# Patient Record
Sex: Female | Born: 1985 | Race: Black or African American | Hispanic: No | Marital: Single | Smoking: Former smoker
Health system: Southern US, Community
[De-identification: ages and names within clinical notes are randomized; demographics above are authoritative.]

---

## 2018-02-11 ENCOUNTER — Emergency Department (HOSPITAL_COMMUNITY): Payer: Self-pay

## 2018-02-11 ENCOUNTER — Other Ambulatory Visit: Payer: Self-pay

## 2018-02-11 ENCOUNTER — Emergency Department (HOSPITAL_COMMUNITY)
Admission: EM | Admit: 2018-02-11 | Discharge: 2018-02-11 | Disposition: A | Discharge status: Home / Self Care | Payer: Self-pay | Attending: Emergency Medicine | Admitting: Emergency Medicine

## 2018-02-11 ENCOUNTER — Encounter (HOSPITAL_COMMUNITY): Payer: Self-pay

## 2018-02-11 DIAGNOSIS — R1030 Lower abdominal pain, unspecified: Secondary | ICD-10-CM

## 2018-02-11 DIAGNOSIS — Z9189 Other specified personal risk factors, not elsewhere classified: Secondary | ICD-10-CM

## 2018-02-11 DIAGNOSIS — N83202 Unspecified ovarian cyst, left side: Secondary | ICD-10-CM | POA: Insufficient documentation

## 2018-02-11 LAB — COMPREHENSIVE METABOLIC PANEL
ALT: 12 U/L (ref 0–44)
AST: 14 U/L — ABNORMAL LOW (ref 15–41)
Albumin: 4.3 g/dL (ref 3.5–5.0)
Alkaline Phosphatase: 48 U/L (ref 38–126)
Anion gap: 6 (ref 5–15)
BUN: 12 mg/dL (ref 6–20)
CO2: 24 mmol/L (ref 22–32)
CREATININE: 0.64 mg/dL (ref 0.44–1.00)
Calcium: 8.8 mg/dL — ABNORMAL LOW (ref 8.9–10.3)
Chloride: 107 mmol/L (ref 98–111)
GFR calc non Af Amer: >60 mL/min (ref >60)
Glucose, Bld: 88 mg/dL (ref 70–99)
Potassium: 4 mmol/L (ref 3.5–5.1)
Sodium: 137 mmol/L (ref 135–145)
Total Bilirubin: 0.5 mg/dL (ref 0.3–1.2)
Total Protein: 7.1 g/dL (ref 6.5–8.1)

## 2018-02-11 LAB — URINALYSIS, ROUTINE W REFLEX MICROSCOPIC
Bilirubin Urine: NEGATIVE
Glucose, UA: NEGATIVE mg/dL
Hgb urine dipstick: NEGATIVE
Ketones, ur: NEGATIVE mg/dL
Leukocytes, UA: NEGATIVE
NITRITE: NEGATIVE
PROTEIN: NEGATIVE mg/dL
Specific Gravity, Urine: 1.024 (ref 1.005–1.030)
pH: 5 (ref 5.0–8.0)

## 2018-02-11 LAB — CBC
HCT: 43.4 % (ref 36.0–46.0)
Hemoglobin: 14.1 g/dL (ref 12.0–15.0)
MCH: 32 pg (ref 26.0–34.0)
MCHC: 32.5 g/dL (ref 30.0–36.0)
MCV: 98.6 fL (ref 80.0–100.0)
NRBC: 0 % (ref 0.0–0.2)
Platelets: 235 10*3/uL (ref 150–400)
RBC: 4.4 MIL/uL (ref 3.87–5.11)
RDW: 13.9 % (ref 11.5–15.5)
WBC: 4.8 10*3/uL (ref 4.0–10.5)

## 2018-02-11 LAB — I-STAT BETA HCG BLOOD, ED (MC, WL, AP ONLY)

## 2018-02-11 LAB — LIPASE, BLOOD: Lipase: 29 U/L (ref 11–51)

## 2018-02-11 MED ORDER — DICYCLOMINE HCL 10 MG PO CAPS
10.0000 mg | ORAL_CAPSULE | Freq: Once | ORAL | Status: AC
  Administered 2018-02-11: 10 mg via ORAL
  Filled 2018-02-11: qty 1

## 2018-02-11 NOTE — ED Provider Notes (Signed)
[facility]-EMERGENCY DEPT Provider Note   CSN: [id number] Arrival date & time: 02/11/18  0849   History   Chief Complaint Chief Complaint  Patient presents with  . Abdominal Pain    HPI Patricia Watkins is a 32 y.o. female with no past medical history who presents for evaluation of lower abdominal pain.  Patient states pain is been present x3 days.  Patient states her family members did have "a stomach bug but it went away."  Describes her pain as a cramping sensation to her lower abdomen. States she has had call out work the last 2 days due to the pain, when she called out today her employer she would need a note due to missing 3 days. Rates her pain a 5/10. Pain does not radiate. States pain is intermittent in nature. Denies fever, chill, nausea, vomiting, chest pain, SOB, reflux symptoms, dysuria, diarrhea, constipation, vaginal discharge, pelvic pain, concern for STDs, melena, BRBPR.  States she does not want a pelvic exam at this time.  Denies history of previous abdominal surgeries.  States she has been able to tolerate p.o. intake without difficulty.  Has not taken anything for her pain.  History obtained from patient. No interpretor was used.  HPI  History reviewed. No pertinent past medical history.  There are no active problems to display for this patient.    OB History   None      Home Medications    Prior to Admission medications   Not on File    Family History History reviewed. No pertinent family history.  Social History Social History   Tobacco Use  . Smoking status: Not on file  Substance Use Topics  . Alcohol use: Not on file  . Drug use: Not on file     Allergies   Patient has no known allergies.   Review of Systems Review of Systems  Constitutional: Negative.   Respiratory: Negative.   Cardiovascular: Negative.   Gastrointestinal: Positive for abdominal pain. Negative for abdominal distention, anal bleeding, blood  in stool, constipation, diarrhea, nausea, rectal pain and vomiting.  Genitourinary: Negative.   Musculoskeletal: Negative.   Skin: Negative.   Neurological: Negative for dizziness, weakness and light-headedness.  All other systems reviewed and are negative.    Physical Exam Updated Vital Signs BP 103/74   Pulse 80   Temp 97.8 F (36.6 C) (Oral)   Resp 18   LMP 02/04/2018   SpO2 100%   Physical Exam  Constitutional: Vital signs are normal. She appears well-developed and well-nourished.  Non-toxic appearance. She does not have a sickly appearance. She does not appear ill. No distress.  HENT:  Head: Atraumatic.  Mouth/Throat: Oropharynx is clear and moist.  Eyes: Pupils are equal, round, and reactive to light.  Neck: Normal range of motion.  Cardiovascular: Normal rate, regular rhythm, normal heart sounds, intact distal pulses and normal pulses. Exam reveals no friction rub.  No murmur heard. Pulmonary/Chest: Effort normal. No accessory muscle usage or stridor. No tachypnea. No respiratory distress. She has no decreased breath sounds. She has no wheezes. She has no rhonchi. She has no rales. She exhibits no tenderness.  Abdominal: Soft. Normal appearance and bowel sounds are normal. She exhibits no shifting dullness, no distension, no pulsatile liver, no fluid wave, no ascites, no pulsatile midline mass and no mass. There is no hepatosplenomegaly. There is no tenderness. There is no rigidity, no rebound, no guarding and no CVA tenderness.  Genitourinary:  Genitourinary Comments:  Patient refuses GU exam.  Musculoskeletal: Normal range of motion.       Lumbar back: Normal.  Lymphadenopathy: No inguinal adenopathy noted on the right or left side.  Neurological: She is alert.  Skin: Skin is warm and dry.  No edema, erythema or warmth.  Psychiatric: She has a normal mood and affect.  Nursing note and vitals reviewed.    ED Treatments / Results  Labs (all labs ordered are listed,  but only abnormal results are displayed) Labs Reviewed  COMPREHENSIVE METABOLIC PANEL - Abnormal; Notable for the following components:      Result Value   Calcium 8.8 (*)    AST 14 (*)    All other components within normal limits  URINALYSIS, ROUTINE W REFLEX MICROSCOPIC - Abnormal; Notable for the following components:   APPearance HAZY (*)    All other components within normal limits  LIPASE, BLOOD  CBC  I-STAT BETA HCG BLOOD, ED ([facility], [facility], [facility] ONLY)    EKG None  Radiology US Pelvic Complete W Transvaginal And Torsion R/o  Result Date: 02/11/2018 CLINICAL DATA:  32 year old female with pain for 3 days. LMP 02/04/2018. EXAM: TRANSABDOMINAL AND TRANSVAGINAL ULTRASOUND OF PELVIS TECHNIQUE: Both transabdominal and transvaginal ultrasound examinations of the pelvis were performed. Transabdominal technique was performed for global imaging of the pelvis including uterus, ovaries, adnexal regions, and pelvic cul-de-sac. It was necessary to proceed with endovaginal exam following the transabdominal exam to visualize the ovaries. COMPARISON:  None FINDINGS: Uterus Measurements: 10.0 x 5.0 x 6.2 centimeters = volume: 162 mL. No fibroids or other mass visualized. Endometrium Thickness: 12 millimeters.  No focal abnormality visualized. Right ovary Measurements: 4.1 x 2.7 x 3.3 centimeters = volume: 19 mL. Heterogeneous parenchyma as seen on images 38 and 46 with a 15 millimeter area of hypoechogenicity with a reticular pattern of echoes. No vascular elements. Left ovary Measurements: 4.0 x 2.4 x 2.1 centimeters = volume: 11 mL. Normal appearance/no adnexal mass. Other findings Small volume of free fluid, simple to mildly complex. IMPRESSION: 1. Small complex right ovarian lesion most resembling a physiologic hemorrhagic cyst. 2. Small volume of pelvic free fluid, also probably physiologic. 3. Normal uterus and left ovary. Electronically Signed   By: Odessa Fleming M.D.   On: 02/11/2018 11:53     Procedures Procedures (including critical care time)  Medications Ordered in ED Medications  dicyclomine (BENTYL) capsule 10 mg (10 mg Oral Given 02/11/18 1107)     Initial Impression / Assessment and Plan / ED Course  I have reviewed the triage vital signs and the nursing notes.  Pertinent labs & imaging results that were available during my care of the patient were reviewed by me and considered in my medical decision making (see chart for details).   32 year old female who appears otherwise well presents for evaluation of abdominal pain.  Pain is been intermittent in nature over the last 3 days.  Pain is located to the lower abdomen.  Denies pelvic pain, vaginal discharge.  Is concerned for STDs and does not want a pelvic exam at this time.  Patient states "I am only here because I called out of work the last 3 days and they said I need to be evaluated to return to work."  Denies fever, nausea, vomiting, diarrhea.  Suprapubic tenderness on exam.  No left lower quadrant or right lower quadrant tenderness on exam.  Exam limited secondary to patient refusing pelvic exam.  Discussed with patient CT scan for evaluation of abdominal  pain.  Discussed risk vs benefit.  Patient voices understanding of risk vs benefit, however refuses additional imaging at this time.  Afebrile, nonseptic, non-ill-appearing.  Able to tolerate p.o. intake in department.  Will obtain labs, urine, US and reevaluate.  On reevaluation patient without abdominal tenderness on re-exam.  No rebound, rigidity or guarding.  Lipase, 29, CBC without evidence of leukocytosis, metabolic panel without any electrolyte abnormality, normal renal and liver function, urinalysis without evidence of infection, hCG negative. US negative.  Patient is nontoxic, nonseptic appearing, in no apparent distress.  Patient's pain and other symptoms adequately managed in emergency department.  Fluid bolus given.  Labs, imaging and vitals reviewed.   Patient does not meet the SIRS or Sepsis criteria.  On repeat exam patient does not have a surgical abdomin and there are no peritoneal signs.  No indication of appendicitis, bowel obstruction, bowel perforation, cholecystitis, diverticulitis, PID or ectopic pregnancy.  Patient discharged home with symptomatic treatment and given strict instructions for follow-up with their primary care physician.  Patient is hemodynamically stable and appropriate for DC home at this time.  I have also discussed reasons to return immediately to the ER.  Patient expresses understanding and agrees with plan.  Clinical Course as of Feb 12 1556  Thu Feb 11, 2018  1544 No evidence of pancreatitis  Lipase, blood [BH]  1544 No evidence of leukocytosis.  Hemoglobin 14.1  CBC [BH]  1544 Metabolic panel without electrolyte abnormality, normal renal liver function.  Comprehensive metabolic panel(!) [BH]  1545 hCG negative  I-Stat beta hCG blood, ED [BH]  1545 Urinalysis negative for infection  Urinalysis, Routine w reflex microscopic(!) [BH]  1545 US PELVIC COMPLETE W TRANSVAGINAL AND TORSION R/O [BH]  1545 Left ovarian cyst.  US PELVIC COMPLETE W TRANSVAGINAL AND TORSION R/O [BH]    Clinical Course User Index [BH] Virdell Hoiland A, PA-C    Final Clinical Impressions(s) / ED Diagnoses   Final diagnoses:  Cyst of left ovary  Lower abdominal pain    ED Discharge Orders    None       Tima Curet A, PA-C 02/11/18 1557    Terrilee FilesButler, Michael C, MD 02/11/18 1743

## 2018-02-11 NOTE — Discharge Instructions (Addendum)
Evaluated today for lower abdominal pain.  You do have evidence of a cyst on your left ovary.  Lab work was negative.  Your urine was negative for infection.  Follow-up with OB/GYN for reevaluation.  Return to the ED for any worsening symptoms.

## 2018-02-11 NOTE — ED Triage Notes (Addendum)
Patient presents with abdominal pain x3 days. Patient denies nausea/vomiting and reports frequent formed "normal bowel movements." Patient reports there was a stomach bug her son and mom caught 3 days ago, which caused them to have runny stool, but the bug passed. Pt reports drinking water trying to "flush out my system" but it isnt working. Patient reports she has called out of work for the abdominal pain the past two days, and was told by her boss today if she called out again, she had to be checked out by a doctor.

## 2018-07-10 ENCOUNTER — Emergency Department (HOSPITAL_COMMUNITY)
Admission: EM | Admit: 2018-07-10 | Discharge: 2018-07-10 | Disposition: A | Discharge status: Home / Self Care | Payer: Medicaid Other | Attending: Emergency Medicine | Admitting: Emergency Medicine

## 2018-07-10 ENCOUNTER — Encounter (HOSPITAL_COMMUNITY): Payer: Self-pay | Admitting: Emergency Medicine

## 2018-07-10 ENCOUNTER — Other Ambulatory Visit: Payer: Self-pay

## 2018-07-10 DIAGNOSIS — Z87891 Personal history of nicotine dependence: Secondary | ICD-10-CM | POA: Insufficient documentation

## 2018-07-10 DIAGNOSIS — K029 Dental caries, unspecified: Secondary | ICD-10-CM

## 2018-07-10 MED ORDER — IBUPROFEN 600 MG PO TABS: 600.0000 mg | ORAL_TABLET | Freq: Three times a day (TID) | ORAL | 0 refills | Status: AC | PRN

## 2018-07-10 MED ORDER — PENICILLIN V POTASSIUM 500 MG PO TABS: 500.0000 mg | ORAL_TABLET | Freq: Three times a day (TID) | ORAL | 0 refills | Status: DC

## 2018-07-10 NOTE — ED Provider Notes (Signed)
  [facility]-EMERGENCY DEPT Provider Note   CSN: [id number] Arrival date & time: 07/10/18  1255    History   Chief Complaint Chief Complaint  Patient presents with  . Dental Pain    HPI Shardee Brizendine is a 33 y.o. female.     HPI 33 year old female presents the emergency department with several days of worsening right upper dental pain without significant swelling of her face.  She reports no difficulty swallowing.  Some pain under her tongue but without swelling.  No difficulty breathing.  No fevers or chills.  No other complaints.  Pain is moderate in severity.  No meds attempted prior to arrival    History reviewed. No pertinent past medical history.  There are no active problems to display for this patient.   History reviewed. No pertinent surgical history.   OB History   No obstetric history on file.      Home Medications    Prior to Admission medications   Not on File    Family History No family history on file.  Social History Social History   Tobacco Use  . Smoking status: Former Smoker  Substance Use Topics  . Alcohol use: Not on file  . Drug use: Not on file     Allergies   Patient has no known allergies.   Review of Systems Review of Systems  All other systems reviewed and are negative.    Physical Exam Updated Vital Signs BP 113/81 (BP Location: Right Arm)   Pulse 79   Temp 98.8 F (37.1 C) (Oral)   Resp 18   LMP 07/04/2018 (Exact Date)   SpO2 100%   Physical Exam Vitals signs and nursing note reviewed.  Constitutional:      Appearance: She is well-developed.  HENT:     Head: Normocephalic.     Comments: Poor dental hygiene.  Multiple decayed molars bilaterally.  Some tenderness of the remnant molar in the right upper.  No gingival swelling or fluctuance.  No significant facial swelling.  Tolerating secretions.  Oral airway patent.  No trismus Neck:     Musculoskeletal: Normal range of motion.   Pulmonary:     Effort: Pulmonary effort is normal.  Abdominal:     General: There is no distension.  Musculoskeletal: Normal range of motion.  Neurological:     Mental Status: She is alert and oriented to person, place, and time.      ED Treatments / Results  Labs (all labs ordered are listed, but only abnormal results are displayed) Labs Reviewed - No data to display  EKG None  Radiology No results found.  Procedures Procedures (including critical care time)  Medications Ordered in ED Medications - No data to display   Initial Impression / Assessment and Plan / ED Course  I have reviewed the triage vital signs and the nursing notes.  Pertinent labs & imaging results that were available during my care of the patient were reviewed by me and considered in my medical decision making (see chart for details).        Dental Pain. Home with antibiotics and pain medicine. Recommend dental follow up. No signs of gingival abscess. Tolerating secretions. Airway patent. No sub lingular swelling   Final Clinical Impressions(s) / ED Diagnoses   Final diagnoses:  Pain due to dental caries    ED Discharge Orders    None       Azalia Bilis, MD 07/10/18 1342

## 2018-07-10 NOTE — ED Notes (Signed)
RX X 2 GIVEN 

## 2018-07-10 NOTE — ED Notes (Signed)
PT WALKED TO TRIAGE WITH CHILD TO GET SNACKS

## 2018-07-10 NOTE — ED Triage Notes (Signed)
Pt c/o dental pain in R upper tooth. Pt states she is unable to eat and unable to chew or bite down. Pt states she has swollen glands in neck and under tongue.

## 2018-07-10 NOTE — Discharge Instructions (Addendum)
Please call a dentist for follow up °

## 2019-03-31 IMAGING — US US PELV - US TRANSVAGINAL
1 series · 13 of 25 positions shown · non-contrast
Comparison: None

CLINICAL DATA: 32-year-old female with pain for 3 days. LMP
02/04/2018.



[Series 1: us pelv - us transvaginal · 0.24mm/px · 13 of 59 slices shown]
[im 1/59]
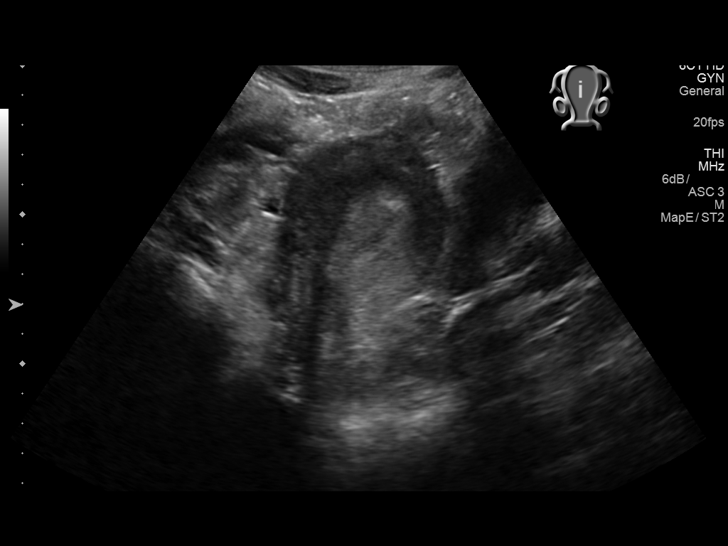
[im 5/59]
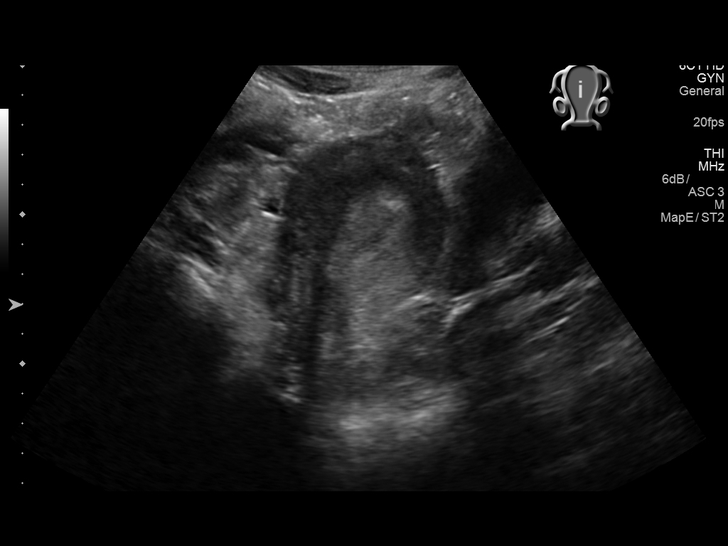
[im 10/59]
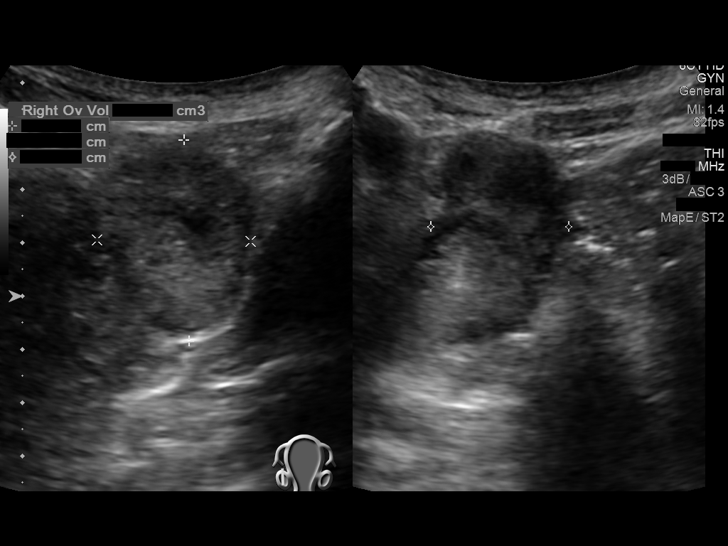
[im 15/59]
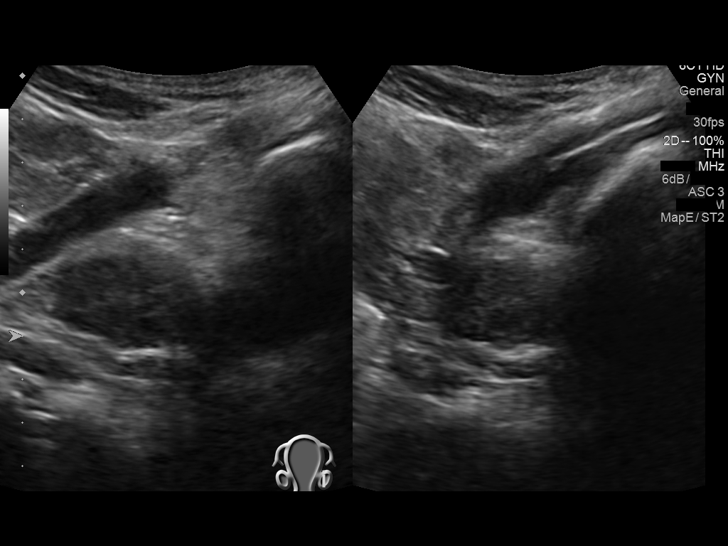
[im 20/59]
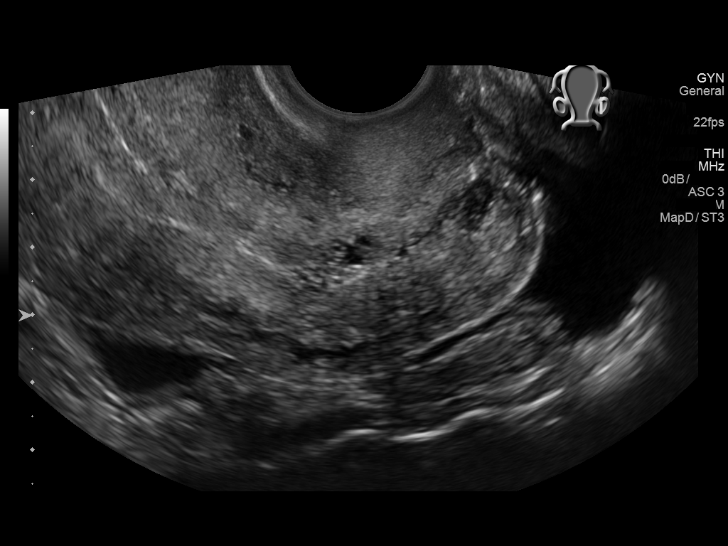
[im 25/59]
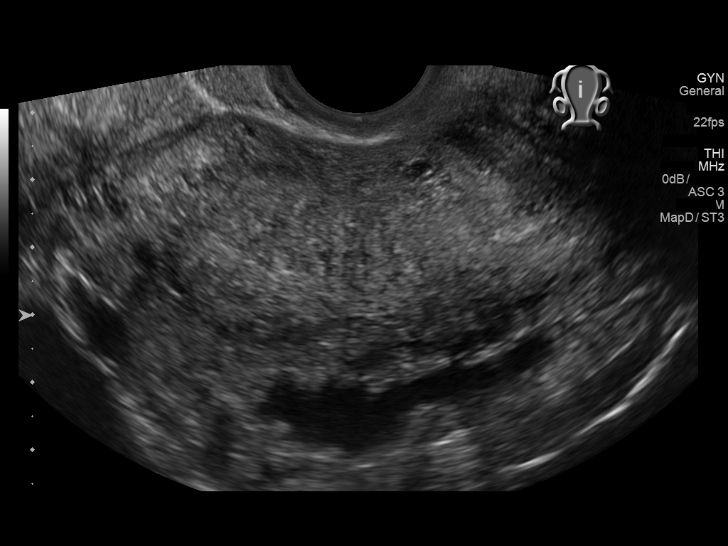
[im 30/59]
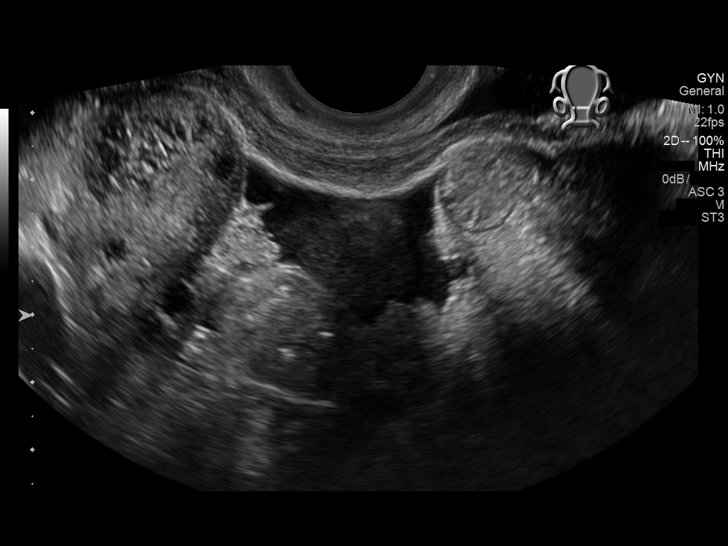
[im 34/59]
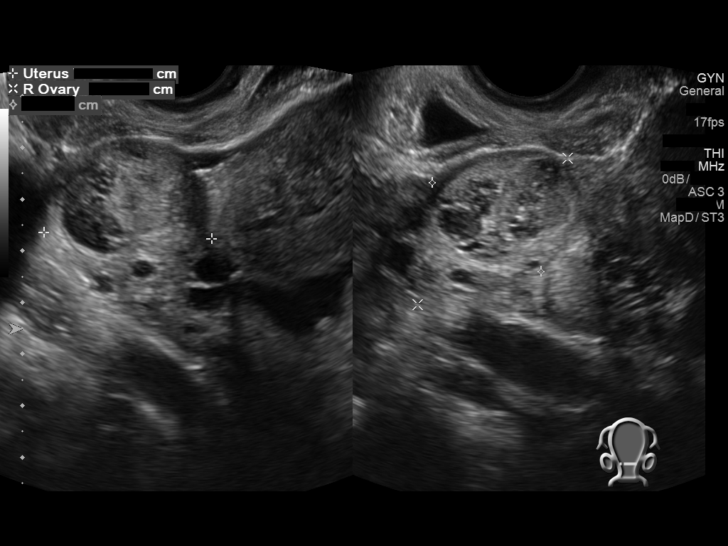
[im 39/59]
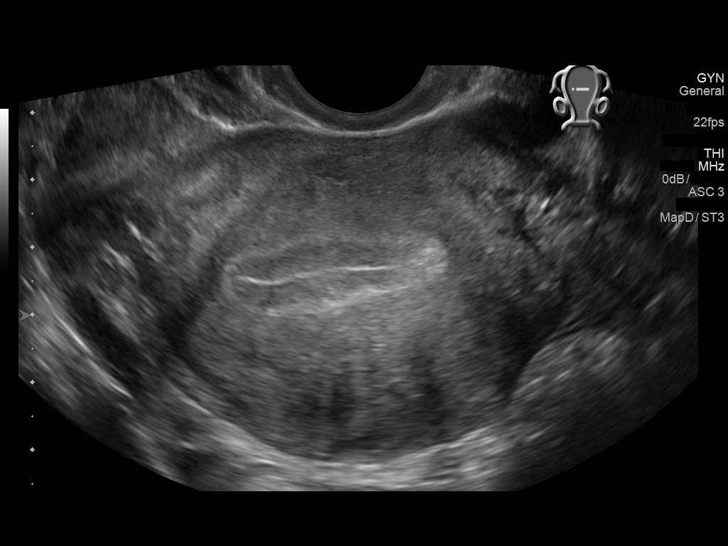
[im 44/59]
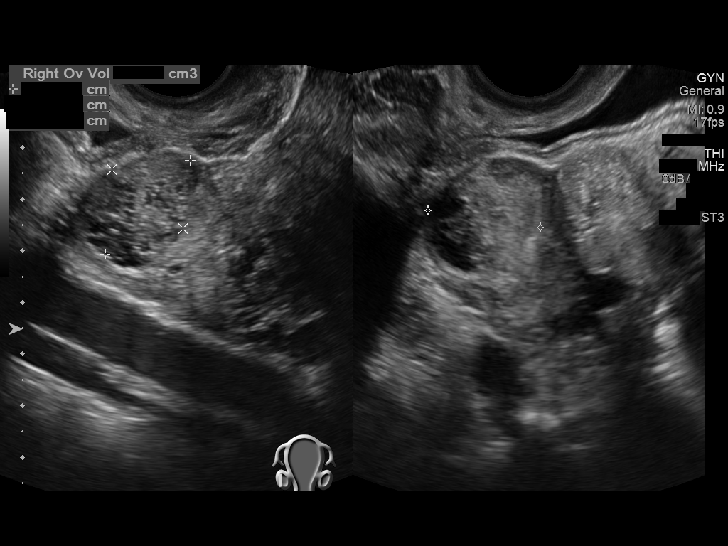
[im 49/59]
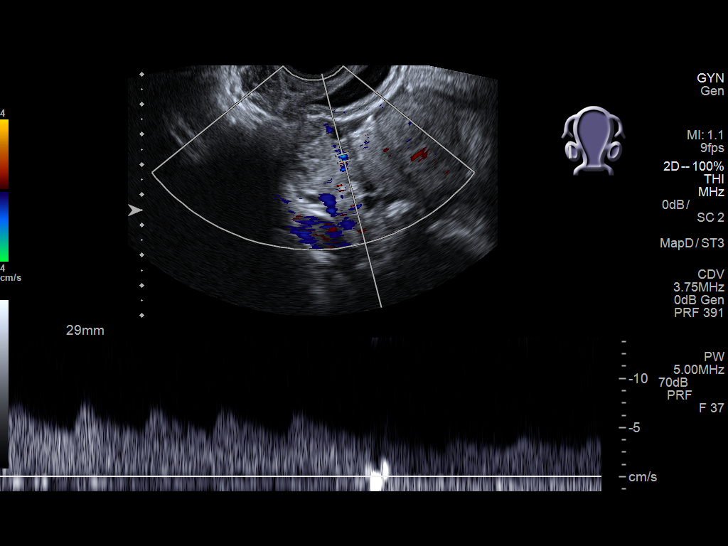
[im 54/59]
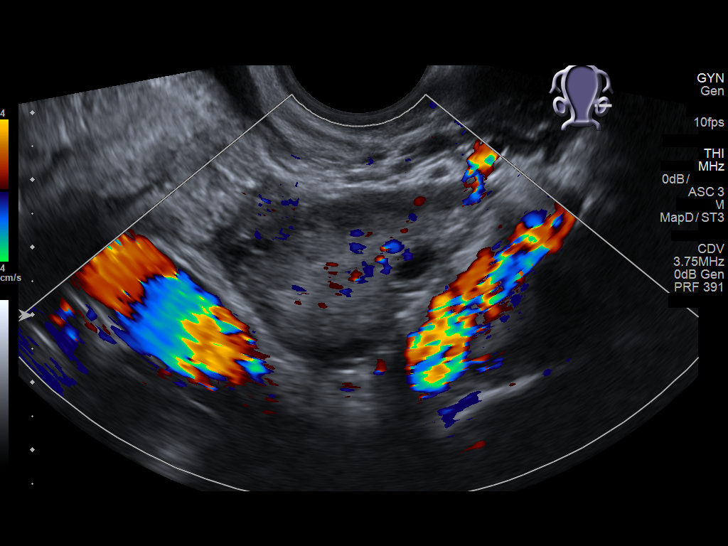
[im 59/59]
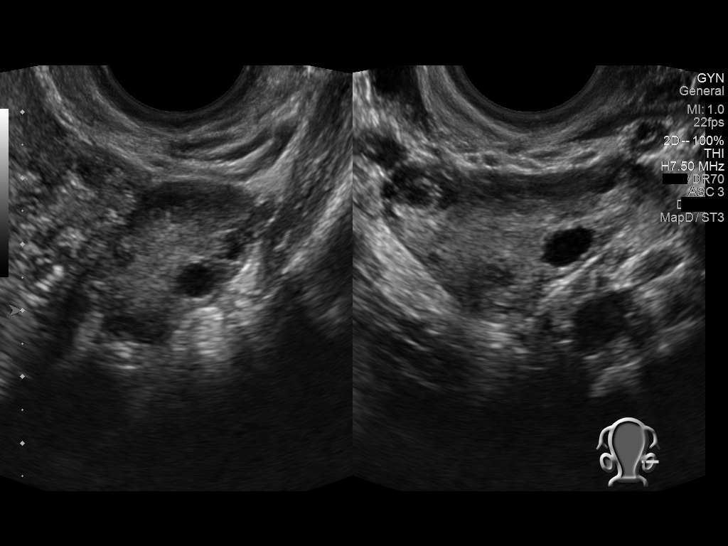

[13 of 25 positions shown; findings below may reference images not displayed]

FINDINGS: Uterus

Measurements: 10.0 x 5.0 x 6.2 centimeters = volume: 162 mL. No
fibroids or other mass visualized.

Endometrium

Thickness: 12 millimeters.  No focal abnormality visualized.

Right ovary

Measurements: 4.1 x 2.7 x 3.3 centimeters = volume: 19 mL.
Heterogeneous parenchyma as seen on images 38 and 46 with a 15
millimeter area of hypoechogenicity with a reticular pattern of
echoes. No vascular elements.

Left ovary

Measurements: 4.0 x 2.4 x 2.1 centimeters = volume: 11 mL. Normal
appearance/no adnexal mass.

Other findings

Small volume of free fluid, simple to mildly complex.
IMPRESSION: 1. Small complex right ovarian lesion most resembling a physiologic
hemorrhagic cyst.
2. Small volume of pelvic free fluid, also probably physiologic.
3. Normal uterus and left ovary.

## 2023-09-04 ENCOUNTER — Emergency Department (HOSPITAL_BASED_OUTPATIENT_CLINIC_OR_DEPARTMENT_OTHER): Payer: Self-pay

## 2023-09-04 ENCOUNTER — Encounter (HOSPITAL_BASED_OUTPATIENT_CLINIC_OR_DEPARTMENT_OTHER): Payer: Self-pay

## 2023-09-04 ENCOUNTER — Other Ambulatory Visit: Payer: Self-pay

## 2023-09-04 DIAGNOSIS — R0602 Shortness of breath: Secondary | ICD-10-CM | POA: Insufficient documentation

## 2023-09-04 DIAGNOSIS — Z5321 Procedure and treatment not carried out due to patient leaving prior to being seen by health care provider: Secondary | ICD-10-CM | POA: Insufficient documentation

## 2023-09-04 DIAGNOSIS — R0789 Other chest pain: Secondary | ICD-10-CM | POA: Insufficient documentation

## 2023-09-04 LAB — BASIC METABOLIC PANEL WITH GFR
Anion gap: 14 (ref 5–15)
BUN: 6 mg/dL (ref 6–20)
CO2: 21 mmol/L — ABNORMAL LOW (ref 22–32)
Calcium: 8.9 mg/dL (ref 8.9–10.3)
Chloride: 101 mmol/L (ref 98–111)
Creatinine, Ser: 0.79 mg/dL (ref 0.44–1.00)
GFR, Estimated: >60 mL/min (ref >60)
Glucose, Bld: 91 mg/dL (ref 70–99)
Potassium: 3.8 mmol/L (ref 3.5–5.1)
Sodium: 136 mmol/L (ref 135–145)

## 2023-09-04 LAB — CBC
HCT: 35.8 % — ABNORMAL LOW (ref 36.0–46.0)
Hemoglobin: 12.2 g/dL (ref 12.0–15.0)
MCH: 33.2 pg (ref 26.0–34.0)
MCHC: 34.1 g/dL (ref 30.0–36.0)
MCV: 97.3 fL (ref 80.0–100.0)
Platelets: 202 10*3/uL (ref 150–400)
RBC: 3.68 MIL/uL — ABNORMAL LOW (ref 3.87–5.11)
RDW: 13.5 % (ref 11.5–15.5)
WBC: 9.2 10*3/uL (ref 4.0–10.5)
nRBC: 0 % (ref 0.0–0.2)

## 2023-09-04 LAB — TROPONIN T, HIGH SENSITIVITY: Troponin T High Sensitivity: <15 ng/L (ref <19)

## 2023-09-04 NOTE — ED Triage Notes (Signed)
 Pt coming POV from home for CP waking patient up from sleep last night with SOB. Pt reports symptoms worsening throughout the day. Pain is located on L side of chest radiating through to back. Pain is sharp and occurs primarily on inspiration. Denies fever. Pt talking in complete sentences in triage. No respiratory distress noted.

## 2023-09-05 ENCOUNTER — Emergency Department (HOSPITAL_BASED_OUTPATIENT_CLINIC_OR_DEPARTMENT_OTHER)
Admission: EM | Admit: 2023-09-05 | Discharge: 2023-09-05 | Discharge status: Against Medical Advice | Payer: Self-pay | Attending: Emergency Medicine | Admitting: Emergency Medicine

## 2023-09-05 LAB — PREGNANCY, URINE: Preg Test, Ur: NEGATIVE

## 2023-09-05 LAB — RESP PANEL BY RT-PCR (RSV, FLU A&B, COVID)  RVPGX2
Influenza A by PCR: NEGATIVE
Influenza B by PCR: NEGATIVE
Resp Syncytial Virus by PCR: NEGATIVE
SARS Coronavirus 2 by RT PCR: NEGATIVE

## 2023-09-06 ENCOUNTER — Emergency Department (HOSPITAL_BASED_OUTPATIENT_CLINIC_OR_DEPARTMENT_OTHER): Payer: Self-pay

## 2023-09-06 ENCOUNTER — Emergency Department (HOSPITAL_BASED_OUTPATIENT_CLINIC_OR_DEPARTMENT_OTHER)
Admission: EM | Admit: 2023-09-06 | Discharge: 2023-09-06 | Disposition: A | Discharge status: Home / Self Care | Payer: Self-pay | Attending: Emergency Medicine | Admitting: Emergency Medicine

## 2023-09-06 ENCOUNTER — Encounter (HOSPITAL_BASED_OUTPATIENT_CLINIC_OR_DEPARTMENT_OTHER): Payer: Self-pay

## 2023-09-06 ENCOUNTER — Other Ambulatory Visit: Payer: Self-pay

## 2023-09-06 DIAGNOSIS — J181 Lobar pneumonia, unspecified organism: Secondary | ICD-10-CM | POA: Insufficient documentation

## 2023-09-06 DIAGNOSIS — J189 Pneumonia, unspecified organism: Secondary | ICD-10-CM

## 2023-09-06 LAB — CBC
HCT: 37.6 % (ref 36.0–46.0)
Hemoglobin: 13.1 g/dL (ref 12.0–15.0)
MCH: 33.6 pg (ref 26.0–34.0)
MCHC: 34.8 g/dL (ref 30.0–36.0)
MCV: 96.4 fL (ref 80.0–100.0)
Platelets: 216 10*3/uL (ref 150–400)
RBC: 3.9 MIL/uL (ref 3.87–5.11)
RDW: 13.2 % (ref 11.5–15.5)
WBC: 7.4 10*3/uL (ref 4.0–10.5)
nRBC: 0 % (ref 0.0–0.2)

## 2023-09-06 LAB — BASIC METABOLIC PANEL WITH GFR
Anion gap: 13 (ref 5–15)
BUN: 7 mg/dL (ref 6–20)
CO2: 23 mmol/L (ref 22–32)
Calcium: 9.1 mg/dL (ref 8.9–10.3)
Chloride: 101 mmol/L (ref 98–111)
Creatinine, Ser: 0.77 mg/dL (ref 0.44–1.00)
GFR, Estimated: >60 mL/min (ref >60)
Glucose, Bld: 82 mg/dL (ref 70–99)
Potassium: 3.7 mmol/L (ref 3.5–5.1)
Sodium: 137 mmol/L (ref 135–145)

## 2023-09-06 LAB — PREGNANCY, URINE: Preg Test, Ur: NEGATIVE

## 2023-09-06 MED ORDER — AMOXICILLIN-POT CLAVULANATE 875-125 MG PO TABS
1.0000 | ORAL_TABLET | Freq: Two times a day (BID) | ORAL | 0 refills | Status: AC
Start: 2023-09-06 — End: ?

## 2023-09-06 MED ORDER — AMOXICILLIN-POT CLAVULANATE 875-125 MG PO TABS
1.0000 | ORAL_TABLET | Freq: Once | ORAL | Status: AC
  Administered 2023-09-06: 1 via ORAL
  Filled 2023-09-06: qty 1

## 2023-09-06 MED ORDER — PREDNISONE 20 MG PO TABS: 40.0000 mg | ORAL_TABLET | Freq: Every day | ORAL | 0 refills | Status: AC

## 2023-09-06 MED ORDER — IOHEXOL 350 MG/ML SOLN
75.0000 mL | Freq: Once | INTRAVENOUS | Status: AC | PRN
  Administered 2023-09-06: 75 mL via INTRAVENOUS

## 2023-09-06 NOTE — ED Triage Notes (Signed)
 Pt reports to ED with SOB x 1 week. Reports that she was here last night and that she had to got to work. States that she is having pain on left chest and back. States that she can't take a deep breath. Respiratory called to bedside. Clear upon assessment.

## 2023-09-06 NOTE — ED Provider Notes (Signed)
 [name] Provider Note   CSN: [id number] Arrival date & time: 09/06/23  8190     Patient presents with: Shortness of Breath   Patricia Watkins is a 38 y.o. female.   38 year old female presents today for concern of shortness of breath that has been ongoing for 1 week.  She was seen on 6/27 and had some labs and chest x-ray performed but left before being seen.  Endorses pleuritic chest pain.  Denies productive cough.  No fever.  No prior history of DVT or PE.  No recent long travel, no recent surgery.  The history is provided by the patient. No language interpreter was used.       Prior to Admission medications   Medication Sig Start Date End Date Taking? Authorizing Provider  amoxicillin-clavulanate (AUGMENTIN) 875-125 MG tablet Take 1 tablet by mouth every 12 (twelve) hours. 09/06/23  Yes Prashant Glosser, PA-C  predniSONE (DELTASONE) 20 MG tablet Take 2 tablets (40 mg total) by mouth daily with breakfast for 5 days. 09/06/23 09/11/23 Yes Layton Naves, PA-C  ibuprofen  (ADVIL ) 600 MG tablet Take 1 tablet (600 mg total) by mouth every 8 (eight) hours as needed. 07/10/18   Baxter Drivers, MD    Allergies: Patient has no known allergies.    Review of Systems  Constitutional:  Negative for chills and fever.  Respiratory:  Positive for cough and shortness of breath.   Cardiovascular:  Positive for chest pain.  Neurological:  Negative for light-headedness.  All other systems reviewed and are negative.   Updated Vital Signs BP 100/71 (BP Location: Right Arm)   Pulse 84   Temp 99.6 F (37.6 C) (Oral)   Resp 16   Ht 5' 6 (1.676 m)   Wt 60.3 kg   LMP 08/11/2023   SpO2 100%   BMI 21.47 kg/m   Physical Exam Vitals and nursing note reviewed.  Constitutional:      General: She is not in acute distress.    Appearance: Normal appearance. She is not ill-appearing.  HENT:     Head: Normocephalic and atraumatic.     Nose: Nose normal.    Eyes:     Conjunctiva/sclera: Conjunctivae normal.    Cardiovascular:     Rate and Rhythm: Normal rate and regular rhythm.  Pulmonary:     Effort: Pulmonary effort is normal. No respiratory distress.     Breath sounds: Normal breath sounds. No wheezing or rales.   Musculoskeletal:        General: No deformity. Normal range of motion.     Cervical back: Normal range of motion.   Skin:    Findings: No rash.   Neurological:     Mental Status: She is alert.     (all labs ordered are listed, but only abnormal results are displayed) Labs Reviewed  BASIC METABOLIC PANEL WITH GFR  CBC  PREGNANCY, URINE    EKG: None  Radiology: CT Angio Chest PE W and/or Wo Contrast Result Date: 09/06/2023 CLINICAL DATA:  Shortness of breath for 1 week. PE suspected. Left-sided chest and back pain. Can take a deep breath. EXAM: CT ANGIOGRAPHY CHEST WITH CONTRAST TECHNIQUE: Multidetector CT imaging of the chest was performed using the standard protocol during bolus administration of intravenous contrast. Multiplanar CT image reconstructions and MIPs were obtained to evaluate the vascular anatomy. RADIATION DOSE REDUCTION: This exam was performed according to the departmental dose-optimization program which includes automated exposure control, adjustment of the mA and/or  kV according to patient size and/or use of iterative reconstruction technique. CONTRAST:  75mL OMNIPAQUE IOHEXOL 350 MG/ML SOLN COMPARISON:  Chest radiograph 09/06/2023 FINDINGS: Cardiovascular: Negative for acute pulmonary embolism. Normal caliber thoracic aorta. No pericardial effusion. Mediastinum/Nodes: Trachea and esophagus are unremarkable. No thoracic adenopathy Lungs/Pleura: Consolidation in the superior segment of the left lower lobe corresponds to the abnormality seen on prior radiographs and is compatible with pneumonia. No pleural effusion or pneumothorax. Upper Abdomen: No acute abnormality. Musculoskeletal: No acute fracture.  Review of the MIP images confirms the above findings. IMPRESSION: 1. Negative for acute pulmonary embolism. 2. Left lower lobe pneumonia. Followup PA and lateral chest X-ray in 8 weeks after treatment is recommended. Electronically Signed   By: Norman Gatlin M.D.   On: 09/06/2023 20:21   DG Chest 2 View Result Date: 09/06/2023 CLINICAL DATA:  Shortness of breath, left-sided chest and back pain EXAM: CHEST - 2 VIEW COMPARISON:  09/04/2023 FINDINGS: Redemonstrated vague opacity projecting over the left mid lung. The right lung is clear. No pleural effusion or pneumothorax. Normal cardiomediastinal silhouette. No displaced rib fractures. IMPRESSION: Persistent vague opacity in the left mid lung. Consider chest CT for further evaluation. Electronically Signed   By: Norman Gatlin M.D.   On: 09/06/2023 19:04   DG Chest 2 View Result Date: 09/05/2023 CLINICAL DATA:  Left-sided chest pain. EXAM: CHEST - 2 VIEW COMPARISON:  None Available. FINDINGS: The heart size and mediastinal contours are within normal limits. A small, ill-defined, very mildly increased opacity is seen overlying the mid left lung, adjacent to the left hilum. No pleural effusion or pneumothorax is identified. The visualized skeletal structures are unremarkable. IMPRESSION: Findings which may represent very mild left perihilar atelectasis and/or infiltrate. Follow-up to resolution or nonemergent chest CT is recommended to exclude the presence of an underlying neoplastic process. Electronically Signed   By: Suzen Dials M.D.   On: 09/05/2023 00:02     Procedures   Medications Ordered in the ED  amoxicillin-clavulanate (AUGMENTIN) 875-125 MG per tablet 1 tablet (has no administration in time range)  iohexol (OMNIPAQUE) 350 MG/ML injection 75 mL (75 mLs Intravenous Contrast Given 09/06/23 1933)                                    Medical Decision Making Amount and/or Complexity of Data Reviewed Labs: ordered. Radiology:  ordered.  Risk Prescription drug management.   Medical Decision Making / ED Course   This patient presents to the ED for concern of shortness of breath, cough, this involves an extensive number of treatment options, and is a complaint that carries with it a high risk of complications and morbidity.  The differential diagnosis includes ACS, PE, pneumonia, MSK pain  MDM: 38 year old female presents today for concern of cough and shortness of breath ongoing for about 1 week.  Pleuritic chest pain as well. Recent workup was overall reassuring.  Chest x-ray showed questionable infiltrate.  Given pleuritic chest pain will order CT scan. CBC, BMP unremarkable.  Chest x-ray with ambiguous findings. CT scan ordered which shows left lower lobe pneumonia. Augmentin prescribed.  Prednisone prescribed.  Discharged in stable condition.  Return precaution discussed.  Patient voices understanding and is in agreement with plan.   Lab Tests: -I ordered, reviewed, and interpreted labs.   The pertinent results include:   Labs Reviewed  BASIC METABOLIC PANEL WITH GFR  CBC  PREGNANCY, URINE  EKG  EKG Interpretation Date/Time:    Ventricular Rate:    PR Interval:    QRS Duration:    QT Interval:    QTC Calculation:   R Axis:      Text Interpretation:           Imaging Studies ordered: I ordered imaging studies including CT angio chest PE study, chest x-ray I independently visualized and interpreted imaging. I agree with the radiologist interpretation   Medicines ordered and prescription drug management: Meds ordered this encounter  Medications   iohexol (OMNIPAQUE) 350 MG/ML injection 75 mL   amoxicillin-clavulanate (AUGMENTIN) 875-125 MG per tablet 1 tablet   amoxicillin-clavulanate (AUGMENTIN) 875-125 MG tablet    Sig: Take 1 tablet by mouth every 12 (twelve) hours.    Dispense:  14 tablet    Refill:  0    Supervising Provider:   CLEOTILDE, BRIAN [[id number]]   predniSONE  (DELTASONE) 20 MG tablet    Sig: Take 2 tablets (40 mg total) by mouth daily with breakfast for 5 days.    Dispense:  10 tablet    Refill:  0    Supervising Provider:   CLEOTILDE ROGUE [[id number]]    -I have reviewed the patients home medicines and have made adjustments as needed    Reevaluation: After the interventions noted above, I reevaluated the patient and found that they have :stayed the same  Co morbidities that complicate the patient evaluation History reviewed. No pertinent past medical history.    Dispostion: Discharged in stable condition.  Return precaution discussed.  Patient voices understanding and is in agreement with plan.    Final diagnoses:  Pneumonia of left lower lobe due to infectious organism    ED Discharge Orders          Ordered    amoxicillin-clavulanate (AUGMENTIN) 875-125 MG tablet  Every 12 hours        09/06/23 2050    predniSONE (DELTASONE) 20 MG tablet  Daily with breakfast        09/06/23 2050               Hildegard Loge, PA-C 09/06/23 2056    Long, Fonda MATSU, MD 09/07/23 1946

## 2023-09-06 NOTE — Discharge Instructions (Addendum)
 CT scan showed pneumonia.  Have sent antibiotic and prednisone into the pharmacy for you.  Establish a primary care provider if you do not have 1 already.  Information for clinic attached above.  Your first dose of antibiotics were given in the emergency department.  Return for any emergent symptoms.

## 2023-09-06 NOTE — ED Notes (Signed)
 Patient transported to CT
